# Patient Record
Sex: Female | Born: 2000 | Race: White | Hispanic: No | Marital: Single | Smoking: Never smoker
Health system: Southern US, Community
[De-identification: ages and names within clinical notes are randomized; demographics above are authoritative.]

---
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
## 2022-10-03 ENCOUNTER — Ambulatory Visit: Payer: Self-pay | Admitting: Family

## 2022-10-03 ENCOUNTER — Encounter: Payer: Self-pay | Admitting: Family

## 2022-10-03 DIAGNOSIS — Z975 Presence of (intrauterine) contraceptive device: Secondary | ICD-10-CM

## 2022-10-03 NOTE — Progress Notes (Signed)
[place] presented for IUD removal and reinsertion today.  She thought she had Skyla and that it was due for change.  Reviewed that she has Mirena which was inserted on 09/25/2019.  Reviewed current efficacy rates for Mirena is effective for up to 8 years.  Signed paperwork for no indication for PAP testing needed until 21 yo.   She is Dietitian for [employer].  Lisaida assisted patient with documentation she needed to verify Mirena insertion from 09/2019.  Closed for administrative purposes.
